# Patient Record
Sex: Male | Born: 1965 | Race: White | Hispanic: No | Marital: Married | Smoking: Never smoker
Health system: Southern US, Community
[De-identification: ages and names within clinical notes are randomized; demographics above are authoritative.]

## PROBLEM LIST (undated history)

## (undated) DIAGNOSIS — Z9289 Personal history of other medical treatment: Secondary | ICD-10-CM

## (undated) HISTORY — DX: Personal history of other medical treatment: Z92.89

---

## 2006-12-19 ENCOUNTER — Ambulatory Visit: Payer: Self-pay | Admitting: Internal Medicine

## 2006-12-28 ENCOUNTER — Ambulatory Visit: Payer: Self-pay | Admitting: Internal Medicine

## 2008-04-24 DIAGNOSIS — Z9289 Personal history of other medical treatment: Secondary | ICD-10-CM

## 2008-04-24 HISTORY — DX: Personal history of other medical treatment: Z92.89

## 2009-01-09 ENCOUNTER — Emergency Department: Payer: Self-pay | Admitting: Emergency Medicine

## 2009-01-12 IMAGING — CT CT ABD-PELV W/O CM
1 of 2 series · 15 of 32 positions shown, 19 images · non-contrast
Comparison: none

REASON FOR EXAM: hematuria back pain eval for kidney stones
COMMENTS:

[Series 2: soft tissue · axial · 0.76mm/px · z∈[-1142,-743]mm · 15 of 151 slices shown, 19 images]
[im 12/151  soft-tissue]
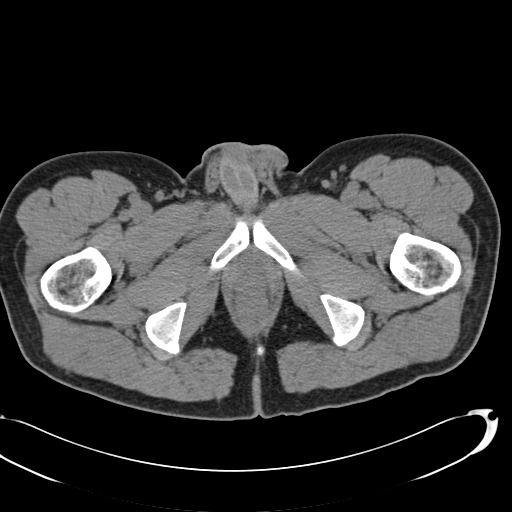
[im 12/151  bone]
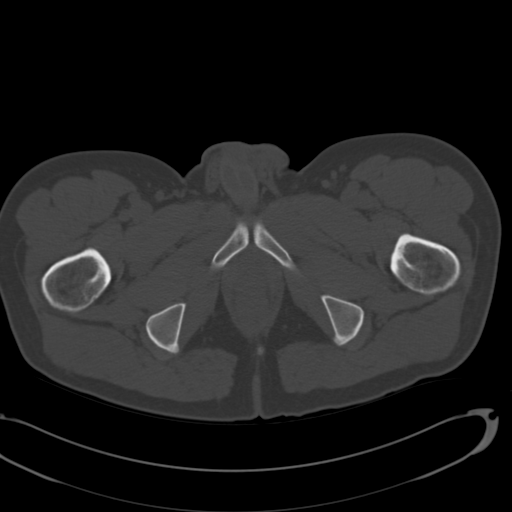
[im 23/151  soft-tissue]
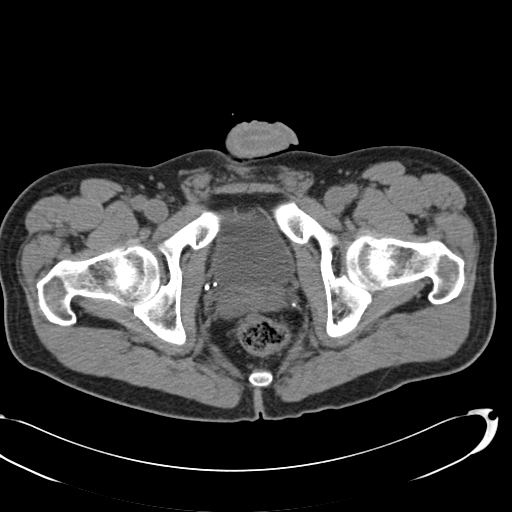
[im 34/151  soft-tissue]
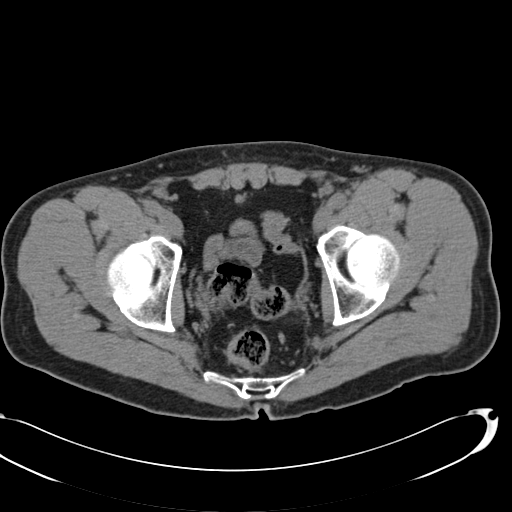
[im 45/151  soft-tissue]
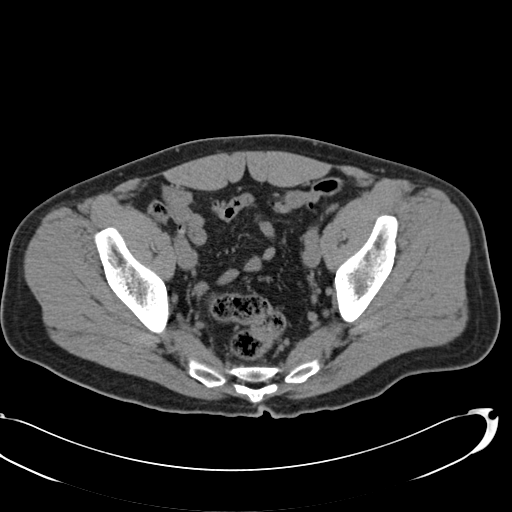
[im 56/151  soft-tissue]
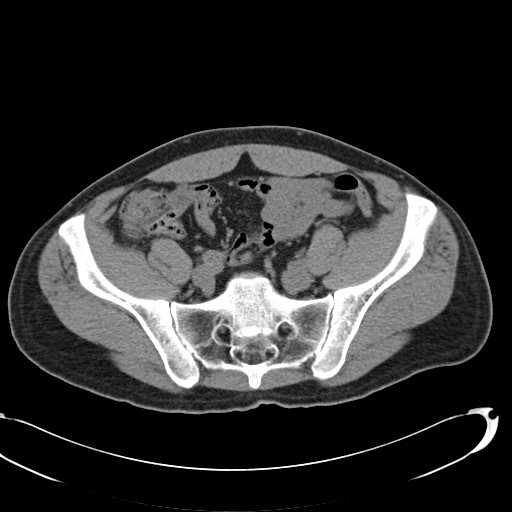
[im 67/151  soft-tissue]
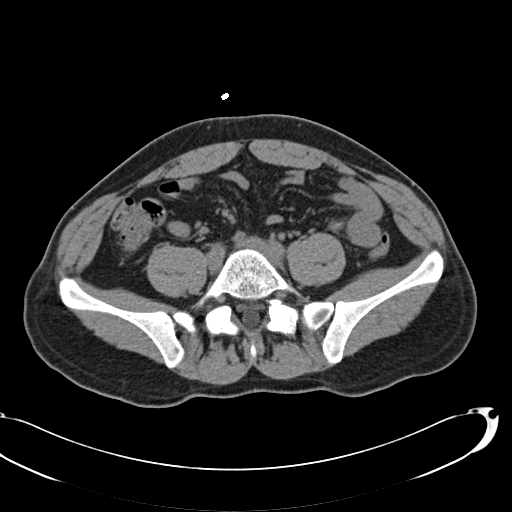
[im 78/151  soft-tissue]
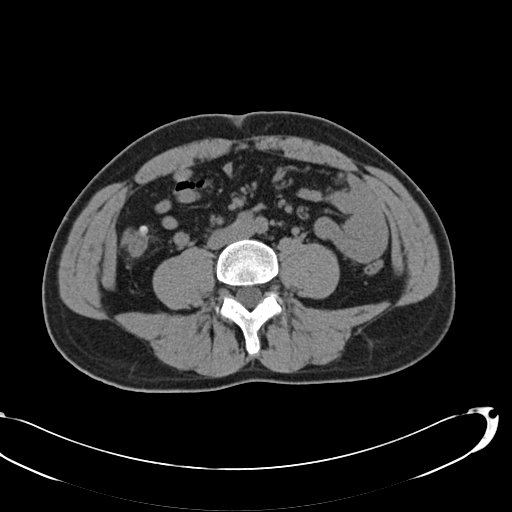
[im 89/151  soft-tissue]
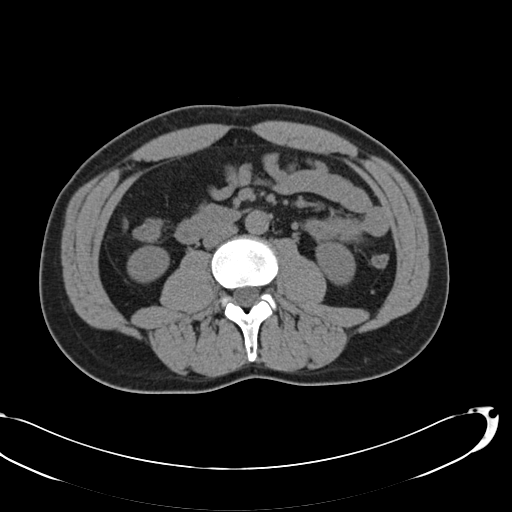
[im 101/151  soft-tissue]
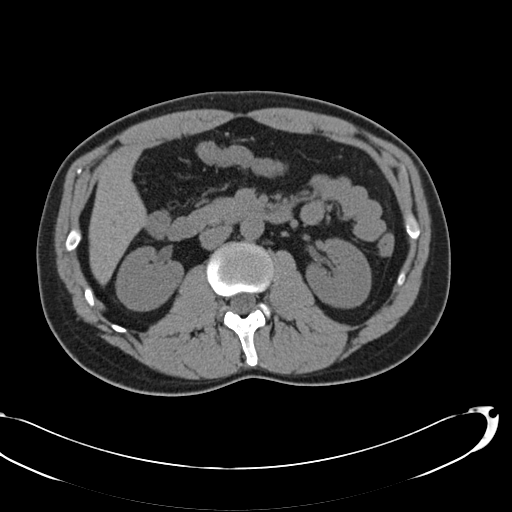
[im 101/151  bone]
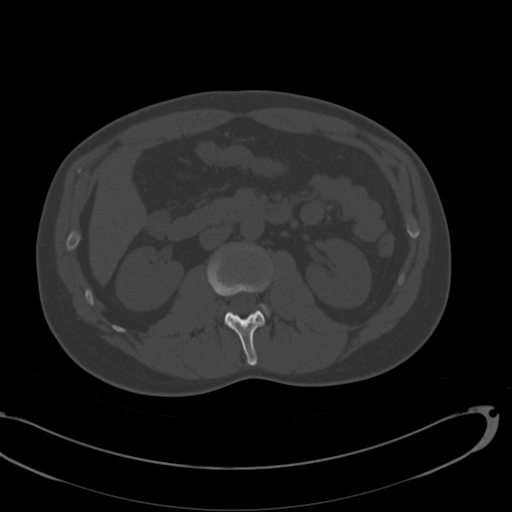
[im 112/151  soft-tissue]
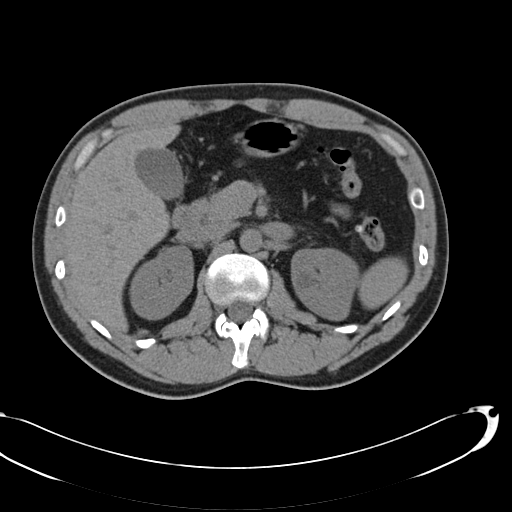
[im 123/151  soft-tissue]
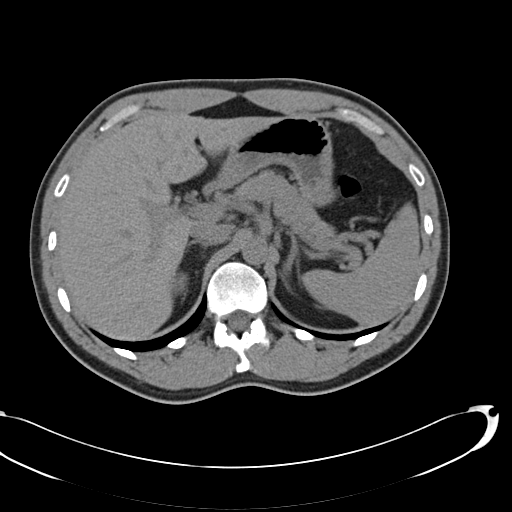
[im 128/151  lung]
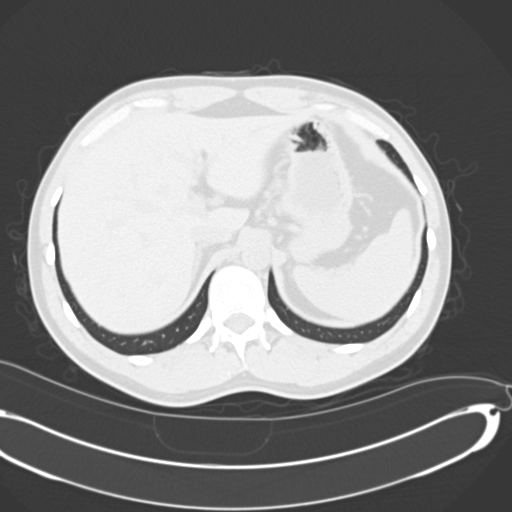
[im 134/151  soft-tissue]
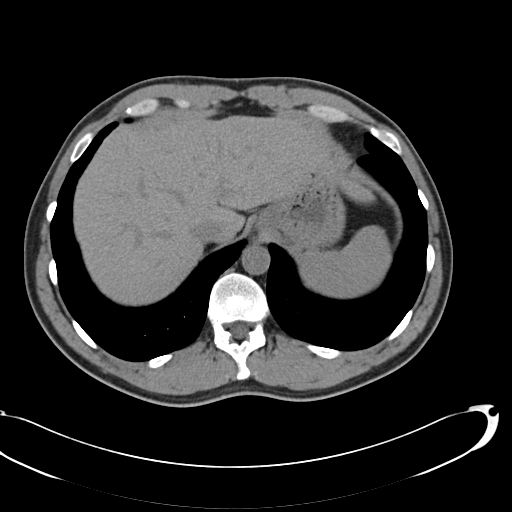
[im 134/151  lung]
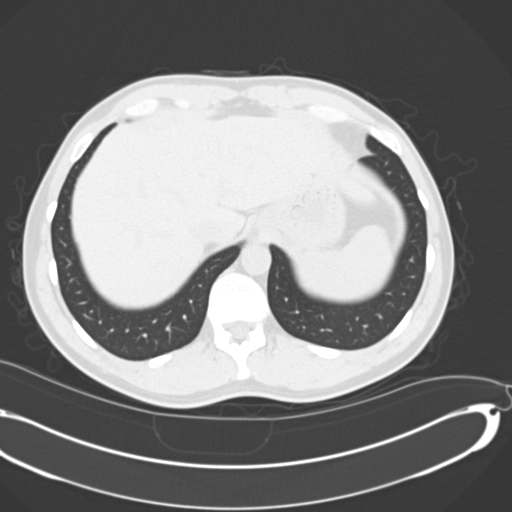
[im 139/151  lung]
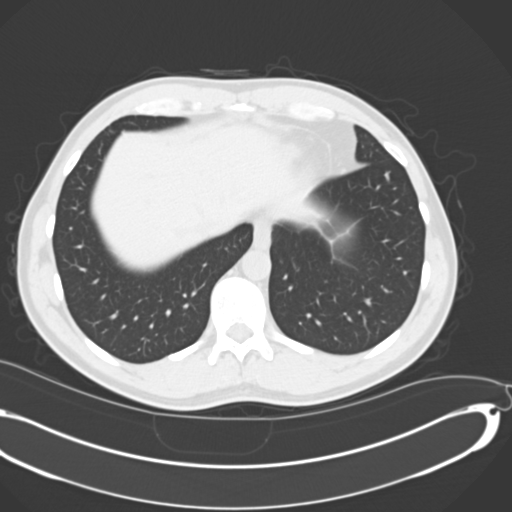
[im 145/151  soft-tissue]
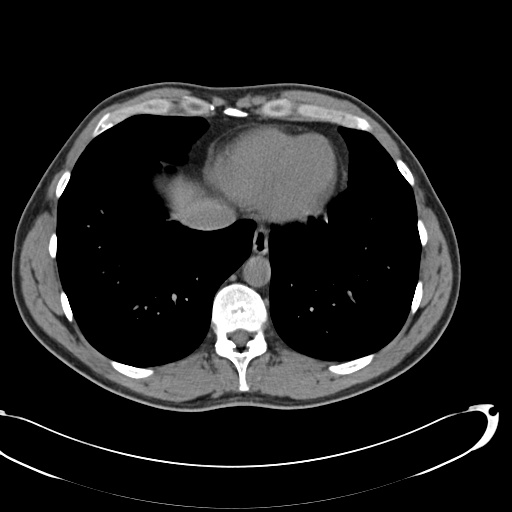
[im 145/151  lung]
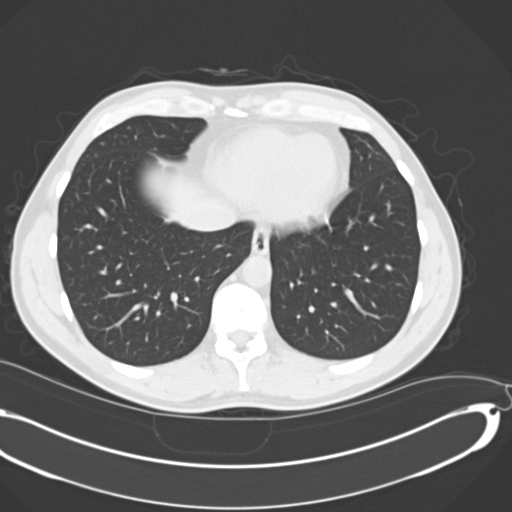

[15 of 32 positions shown; findings below may reference images not displayed]

PROCEDURE:     CT  - CT ABDOMEN AND PELVIS W[DATE]  [DATE]

RESULT:     Patient is undergoing evaluation for possible urinary tract
stones. The patient is complaining of low back pain and has microscopic
hematuria.

Neither the right nor left kidney exhibits evidence of hydronephrosis. In
the medial aspect of the midpole of the left kidney there is a 1.4 cm
diameter hypodensity with Hounsfield measurement of 16. This is most likely
a benign cyst. The perinephric fat is normal in appearance. Along the
expected course of the ureters no calcified stones are identified. There are
phleboliths within the pelvis. The partially distended urinary bladder is
normal in appearance. The prostate gland does not appear abnormally enlarged
for age. There is no free fluid in the pelvis.

There is scattered diverticula involving the colon but I see no evidence of
acute diverticulitis. The appendix is demonstrated and is normal in
appearance. The caliber of the abdominal aorta is normal. The liver,
gallbladder, spleen, stomach, pancreas, and adrenal glands are normal in
appearance. The lung bases are clear.
IMPRESSION: 1. There is no evidence of urinary tract obstruction. There is likely a cyst
in the midpole of the left kidney. Correlation with ultrasound of the
kidneys is recommended.
2. I do not see acute abnormality elsewhere within the abdomen or pelvis.
Scattered diverticula are noted within the bowel but there is no evidence of
acute diverticulitis.
3. The lumbar vertebral bodies are preserved in height and I do not see
significant disc space narrowing or other degenerative change.

If the patient's hematuria persists and remains unexplained, further
evaluation with a triphasic contrast-enhanced CT scan of the abdomen and
pelvis would be of value.

## 2009-01-19 ENCOUNTER — Emergency Department: Payer: Self-pay | Admitting: Emergency Medicine

## 2011-09-19 ENCOUNTER — Ambulatory Visit (INDEPENDENT_AMBULATORY_CARE_PROVIDER_SITE_OTHER): Payer: BC Managed Care – PPO | Admitting: Internal Medicine

## 2011-09-19 ENCOUNTER — Encounter: Payer: Self-pay | Admitting: Internal Medicine

## 2011-09-19 DIAGNOSIS — Z9289 Personal history of other medical treatment: Secondary | ICD-10-CM

## 2011-09-19 DIAGNOSIS — Z228 Carrier of other infectious diseases: Secondary | ICD-10-CM

## 2011-09-19 DIAGNOSIS — R079 Chest pain, unspecified: Secondary | ICD-10-CM

## 2011-09-19 DIAGNOSIS — R0789 Other chest pain: Secondary | ICD-10-CM

## 2011-09-19 DIAGNOSIS — R072 Precordial pain: Secondary | ICD-10-CM

## 2011-09-19 MED ORDER — ASPIRIN EC 81 MG PO TBEC: 81.0000 mg | DELAYED_RELEASE_TABLET | Freq: Every day | ORAL | Status: AC

## 2011-09-19 NOTE — Patient Instructions (Signed)
Return for fasting labs at your leisure  We will call you with the appt with [facility] cardiology for stress testing

## 2011-09-19 NOTE — Progress Notes (Signed)
Patient ID: Wesley Mcconnell, male   DOB: Jul 23, 1965, 46 y.o.   MRN: [id number]  Patient Active Problem List  Diagnoses  . History of positive PPD  . Chest pain, mid sternal    Subjective:  CC:   Chief Complaint  Patient presents with  . Annual Exam    HPI:   Wesley Mcconnell a 46 y.o. male who presents with a 9 month history of atypical chest pain/tightness occurring both with activity and at rest.   Sometimes accompanied by palpitations but denies concurrent diaphoresis,  Reflux symptoms. Jaw or shoulder pain, but occasionally radiates to back .  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  Gregary Signs is currently working on a murder case with [employer] and wants to attribute the chest pain to stress but has recently remarried and wants to rule out  CAD).  He has no history of DM or tobacco use. Drinks 1 beer daily.  No regular use of NSAIDs or aSA.    Past Medical History  Diagnosis Date  . History of positive PPD 2010    treated with isoniazid x 9 months     History reviewed. No pertinent past surgical history.    The following portions of the patient's history were reviewed and updated as appropriate: Allergies, current medications, and problem list.    Review of Systems:   12 Pt  review of systems was negative except those addressed in the HPI,     History   Social History  . Marital Status: Married    Spouse Name: N/A    Number of Children: N/A  . Years of Education: N/A   Occupational History  . Not on file.   Social History Main Topics  . Smoking status: Never Smoker   . Smokeless tobacco: Never Used  . Alcohol Use: Yes  . Drug Use: No  . Sexually Active: Not on file   Other Topics Concern  . Not on file   Social History Narrative  . No narrative on file    Objective:  BP 106/62   Pulse 72  Temp(Src) 98.4 F (36.9 C) (Oral)  Resp 14  Ht 6' (1.829 m)  Wt 185 lb (83.915 kg)  BMI 25.09 kg/m2  SpO2 97%  General appearance: alert, cooperative and appears stated age Ears: normal TM's and external ear canals both ears Throat: lips, mucosa, and tongue normal; teeth and gums normal Neck: no adenopathy, no carotid bruit, supple, symmetrical, trachea midline and thyroid not enlarged, symmetric, no tenderness/mass/nodules Back: symmetric, no curvature. ROM normal. No CVA tenderness. Lungs: clear to auscultation bilaterally Heart: regular rate and rhythm, S1, S2 normal, no murmur, click, rub or gallop Abdomen: soft, non-tender; bowel sounds normal; no masses,  no organomegaly Pulses: 2+ and symmetric Skin: Skin color, texture, turgor normal. No rashes or lesions Lymph nodes: Cervical, supraclavicular, and axillary nodes normal.  Assessment and Plan:  Chest pain, mid sternal Atypical, with no known cardiac risk factors.  , unknown lipid status. EKG done today shows only TWI in lead III only .  Has not worked out in 9 months due to the pain and change in residence and would like to start running but is hesitant to do so without clearance. Return for fasting lipids, lipase and refer to [facility] cardiology for  stress testing. Since he has no signs of gastritis on exam or by history,  Recommended taking a baby aspirin daily .  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  CXR was normal. Received 9 months of oral INH/.     Updated Medication List Outpatient Encounter Prescriptions as of 09/19/2011  Medication Sig Dispense Refill  . aspirin EC 81 MG tablet Take 1 tablet (81 mg total) by mouth daily.  30 tablet  11  . multivitamin-iron-minerals-folic acid (CENTRUM) chewable tablet Chew 1 tablet by mouth daily.         Orders Placed This Encounter  Procedures  . TSH  . Lipid panel    . COMPLETE METABOLIC PANEL WITH GFR  . CK  . Lipase  . Ambulatory referral to Cardiology  . Electrocardiogram report    No Follow-up on file.

## 2011-09-19 NOTE — Assessment & Plan Note (Signed)
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  CXR was normal. Received 9 months of oral INH/.

## 2011-09-19 NOTE — Assessment & Plan Note (Addendum)
Atypical, with no known cardiac risk factors.  , unknown lipid status. EKG done today shows only TWI in lead III only .  Has not worked out in 9 months due to the pain and change in residence and would like to start running but is hesitant to do so without clearance. Return for fasting lipids, lipase and refer to [facility] cardiology for stress testing. Since he has no signs of gastritis on exam or by history,  Recommended taking a baby aspirin daily .

## 2011-09-20 ENCOUNTER — Encounter: Payer: Self-pay | Admitting: Cardiovascular Disease

## 2011-09-20 ENCOUNTER — Ambulatory Visit (INDEPENDENT_AMBULATORY_CARE_PROVIDER_SITE_OTHER): Payer: BC Managed Care – PPO | Admitting: Cardiovascular Disease

## 2011-09-20 VITALS — BP 116/76 | HR 85 | Ht 72.0 in | Wt 188.0 lb

## 2011-09-20 DIAGNOSIS — R0789 Other chest pain: Secondary | ICD-10-CM

## 2011-09-20 DIAGNOSIS — R072 Precordial pain: Secondary | ICD-10-CM

## 2011-09-20 NOTE — Progress Notes (Signed)
    Wesley Mcconnell Date of Birth  Jul 13, 1965       [facility]    [place] [address]  [address] [place], [place]  [address]   [place], [place]  [address] [id number]     [phone]   Fax  [phone]    Fax [phone]  Problem List: 1. Chest pain 2. Palpitations  History of Present Illness:  Wesley Mcconnell is a 46 yo who is a healthy gentleman who presents with chest pain and palpitations.  For the past 9 months, he has had some episodes of chest tightness and flutters.  These were not associated with eating, drinking, change of position.  He has been active until a month or so ago .  He has not been able to exercise recently because he recently got married and has moved.  The pains are not associated with sweats, dyspnea, syncope, dizziness.    He works as the Mudlogger. He spends a lot of time at his desk and on the computer.  Current Outpatient Prescriptions on File Prior to Visit  Medication Sig Dispense Refill  . aspirin EC 81 MG tablet Take 1 tablet (81 mg total) by mouth daily.  30 tablet  11  . multivitamin-iron-minerals-folic acid (CENTRUM) chewable tablet Chew 1 tablet by mouth daily.        No Known Allergies  Past Medical History  Diagnosis Date  . History of positive PPD 2010    treated with isoniazid x 9 months     History reviewed. No pertinent past surgical history.  History  Smoking status  . Never Smoker   Smokeless tobacco  . Never Used    History  Alcohol Use  . 0.5 oz/week  . 1 drink(s) per week    Family History  Problem Relation Age of Onset  . Cancer Neg Hx   . Heart disease Neg Hx     Reviw of Systems:  Reviewed in the HPI.  All other systems are negative.  Physical Exam: Blood pressure 116/76, pulse 85, height 6' (1.829 m), weight 188 lb (85.276 kg). General: Well developed, well nourished, in no acute distress.  Head: Normocephalic, atraumatic, sclera non-icteric, mucus  membranes are moist,   Neck: Supple. Carotids are 2 + without bruits. No JVD  Lungs: Clear bilaterally to auscultation.  Heart: regular rate.  normal  S1 S2. No murmurs, gallops or rubs.  Abdomen: Soft, non-tender, non-distended with normal bowel sounds. No hepatomegaly. No rebound/guarding. No masses.  Msk:  Strength and tone are normal  Extremities: No clubbing or cyanosis. No edema.  Distal pedal pulses are 2+ and equal bilaterally.  Neuro: Alert and oriented X 3. Moves all extremities spontaneously.  Psych:  Responds to questions appropriately with a normal affect.  ECG: Sep 20, 2011-normal sinus rhythm at 79. There are no ST or T wave changes.  Assessment / Plan:

## 2011-09-20 NOTE — Patient Instructions (Signed)
Your physician wants you to follow-up as needed     

## 2011-09-20 NOTE — Assessment & Plan Note (Signed)
Wesley Mcconnell presents with some episodes of palpitations and vague chest discomfort. I suspect a lot of this is due to anxiety. He's able to work out on a regular basis and has not had any episodes of chest pain.  i Offered to schedule him for a stress test but he didn't think that he asked needed as long as I thought that he was okay. I reassured him that everything seemed to check out okay.  We will see him on an as-needed basis. He has labs that will  be drawn in Dr. Melina Schools office tomorrow.

## 2011-09-21 ENCOUNTER — Other Ambulatory Visit (INDEPENDENT_AMBULATORY_CARE_PROVIDER_SITE_OTHER): Payer: BC Managed Care – PPO | Admitting: *Deleted

## 2011-09-21 DIAGNOSIS — R7989 Other specified abnormal findings of blood chemistry: Secondary | ICD-10-CM

## 2011-09-21 DIAGNOSIS — R079 Chest pain, unspecified: Secondary | ICD-10-CM

## 2011-09-21 LAB — LIPASE: Lipase: 37 U/L (ref 11.0–59.0)

## 2011-09-21 LAB — LIPID PANEL
LDL Cholesterol: 123 mg/dL — ABNORMAL HIGH (ref 0–99)
Total CHOL/HDL Ratio: 5
Triglycerides: 90 mg/dL (ref 0.0–149.0)

## 2011-09-22 ENCOUNTER — Telehealth: Payer: Self-pay | Admitting: Internal Medicine

## 2011-09-22 DIAGNOSIS — R748 Abnormal levels of other serum enzymes: Secondary | ICD-10-CM | POA: Insufficient documentation

## 2011-09-22 DIAGNOSIS — R7989 Other specified abnormal findings of blood chemistry: Secondary | ICD-10-CM

## 2011-09-22 LAB — COMPLETE METABOLIC PANEL WITH GFR
ALT: 78 U/L — ABNORMAL HIGH (ref 0–53)
AST: 44 U/L — ABNORMAL HIGH (ref 0–37)
Albumin: 4.5 g/dL (ref 3.5–5.2)
CO2: 26 mEq/L (ref 19–32)
Calcium: 9.3 mg/dL (ref 8.4–10.5)
Chloride: 107 mEq/L (ref 96–112)
Creat: 1.17 mg/dL (ref 0.50–1.35)
GFR, Est African American: 87 mL/min
Potassium: 4.2 mEq/L (ref 3.5–5.3)
Sodium: 141 mEq/L (ref 135–145)
Total Protein: 6.7 g/dL (ref 6.0–8.3)

## 2011-09-22 NOTE — Progress Notes (Signed)
Addended by: Duncan Dull on: 09/22/2011 05:36 PM   Modules accepted: Orders

## 2011-10-11 NOTE — Telephone Encounter (Signed)
Opened in error

## 2011-10-23 ENCOUNTER — Other Ambulatory Visit (INDEPENDENT_AMBULATORY_CARE_PROVIDER_SITE_OTHER): Payer: BC Managed Care – PPO | Admitting: *Deleted

## 2011-10-23 DIAGNOSIS — R7989 Other specified abnormal findings of blood chemistry: Secondary | ICD-10-CM

## 2011-10-23 LAB — COMPREHENSIVE METABOLIC PANEL
ALT: 69 U/L — ABNORMAL HIGH (ref 0–53)
CO2: 26 mEq/L (ref 19–32)
Calcium: 9.1 mg/dL (ref 8.4–10.5)
Chloride: 103 mEq/L (ref 96–112)
Creatinine, Ser: 0.9 mg/dL (ref 0.4–1.5)
GFR: 94.36 mL/min (ref >60.00)
Glucose, Bld: 102 mg/dL — ABNORMAL HIGH (ref 70–99)
Total Bilirubin: 0.4 mg/dL (ref 0.3–1.2)

## 2011-10-23 LAB — HEPATIC FUNCTION PANEL
AST: 38 U/L — ABNORMAL HIGH (ref 0–37)
Albumin: 4.2 g/dL (ref 3.5–5.2)
Alkaline Phosphatase: 61 U/L (ref 39–117)
Bilirubin, Direct: 0.1 mg/dL (ref 0.0–0.3)
Total Bilirubin: 0.4 mg/dL (ref 0.3–1.2)

## 2011-10-25 LAB — HEPATITIS PANEL, ACUTE
HCV Ab: NEGATIVE
Hep A IgM: NEGATIVE

## 2011-10-27 NOTE — Addendum Note (Signed)
Addended by: Duncan Dull on: 10/27/2011 01:05 PM   Modules accepted: Orders

## 2011-11-02 ENCOUNTER — Ambulatory Visit: Payer: Self-pay | Admitting: Internal Medicine

## 2011-11-02 ENCOUNTER — Other Ambulatory Visit (INDEPENDENT_AMBULATORY_CARE_PROVIDER_SITE_OTHER): Payer: BC Managed Care – PPO | Admitting: *Deleted

## 2011-11-02 DIAGNOSIS — R7989 Other specified abnormal findings of blood chemistry: Secondary | ICD-10-CM

## 2011-11-02 LAB — IRON AND TIBC
%SAT: 30 % (ref 20–55)
Iron: 91 ug/dL (ref 42–165)
TIBC: 308 ug/dL (ref 215–435)
UIBC: 217 ug/dL (ref 125–400)

## 2011-11-03 LAB — ANTI-SMITH ANTIBODY: ENA SM Ab Ser-aCnc: <1 AU/mL (ref <30)

## 2011-11-06 ENCOUNTER — Telehealth: Payer: Self-pay | Admitting: *Deleted

## 2011-11-06 DIAGNOSIS — R7989 Other specified abnormal findings of blood chemistry: Secondary | ICD-10-CM

## 2011-11-06 DIAGNOSIS — R945 Abnormal results of liver function studies: Secondary | ICD-10-CM

## 2011-11-06 LAB — PROTEIN ELECTROPHORESIS, SERUM
Alpha-2-Globulin: 7.4 % (ref 7.1–11.8)
Beta 2: 4.8 % (ref 3.2–6.5)
Beta Globulin: 5.7 % (ref 4.7–7.2)
Gamma Globulin: 13.5 % (ref 11.1–18.8)

## 2011-11-06 NOTE — Telephone Encounter (Signed)
Referral in EP[facility] for [facility]

## 2011-11-06 NOTE — Telephone Encounter (Signed)
Pt informed of lab results, pt would like referral to GI specialist as advised by Dr. Darrick Huntsman.

## 2011-11-07 ENCOUNTER — Telehealth: Payer: Self-pay | Admitting: Internal Medicine

## 2011-11-07 NOTE — Telephone Encounter (Signed)
SHE WOULD BE A NEW PATIENT AT [facility].  THEIR FIRST AVAILABLE IS 8-29.  IS THAT TOO LONG TO WAIT.  DO YOU WANT ME TO CALL SOMEONE ELSE

## 2011-11-07 NOTE — Telephone Encounter (Signed)
Can you try [facility] at [facility]?  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

## 2011-11-14 ENCOUNTER — Encounter: Payer: Self-pay | Admitting: Internal Medicine

## 2011-12-14 ENCOUNTER — Encounter: Payer: Self-pay | Admitting: Internal Medicine

## 2013-11-17 IMAGING — US ABDOMEN ULTRASOUND LIMITED
1 series · 14 of 25 positions shown · non-contrast
Comparison: none

REASON FOR EXAM: abn blood chemistry
COMMENTS:

PROCEDURE:     RUDI - RUDI ABDOMEN UPPER GENERAL  - November 02, 2011 [DATE]
RESULT:     Comparison: None.
TECHNIQUE: Multiple grayscale and color Doppler images were obtained of the
abdomen.

[Series 1: abdomen ultrasound limited · 0.25mm/px · 14 of 101 slices shown]
[im 1/101]
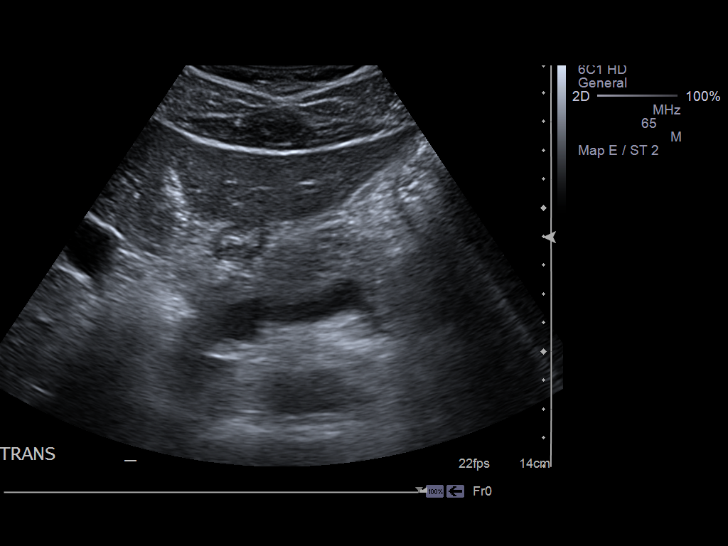
[im 9/101]
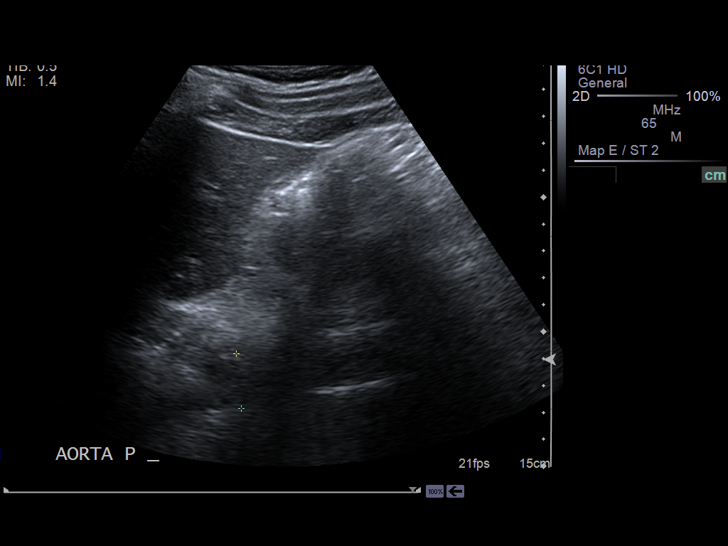
[im 17/101]
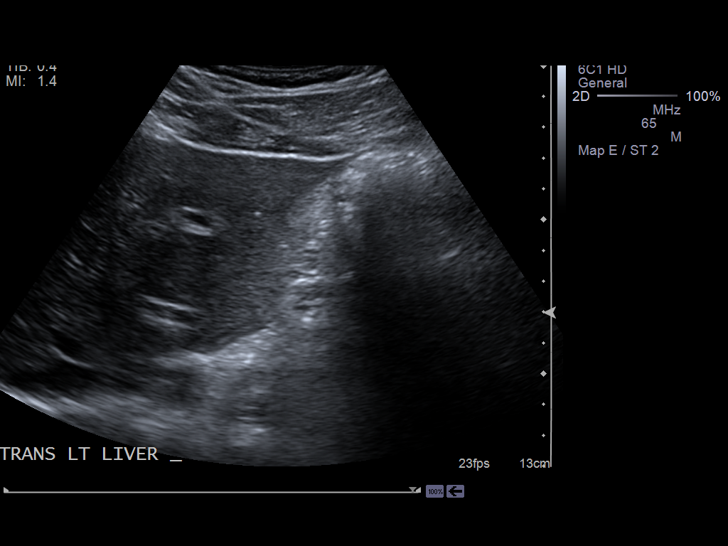
[im 26/101]
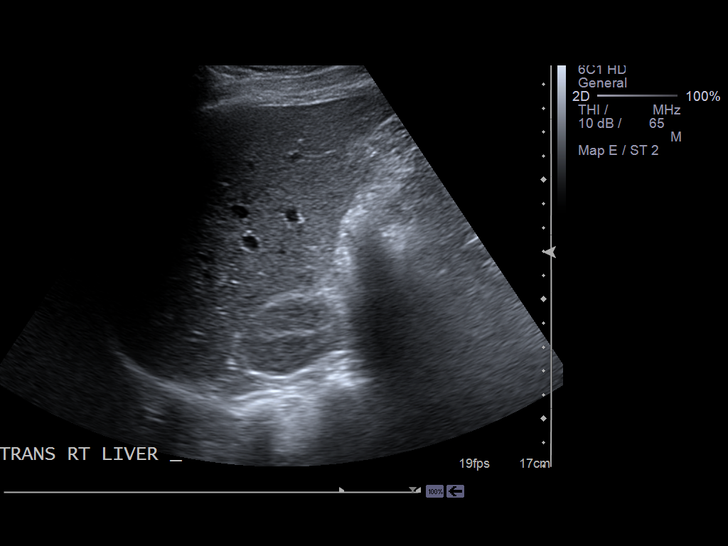
[im 34/101]
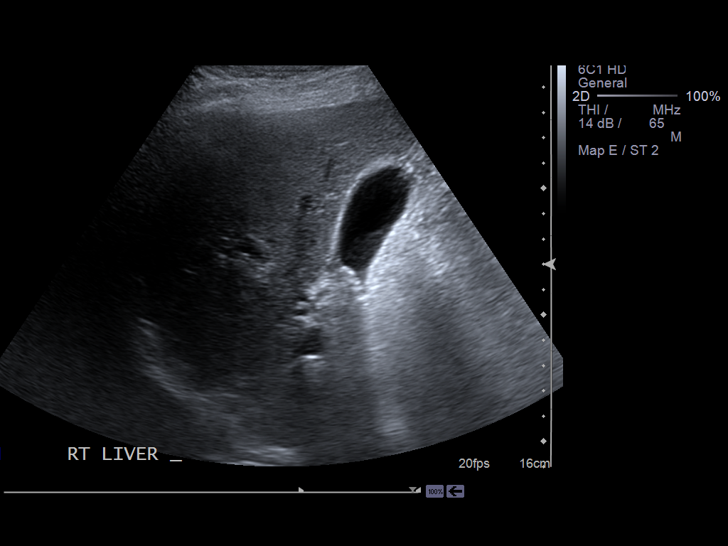
[im 38/101]
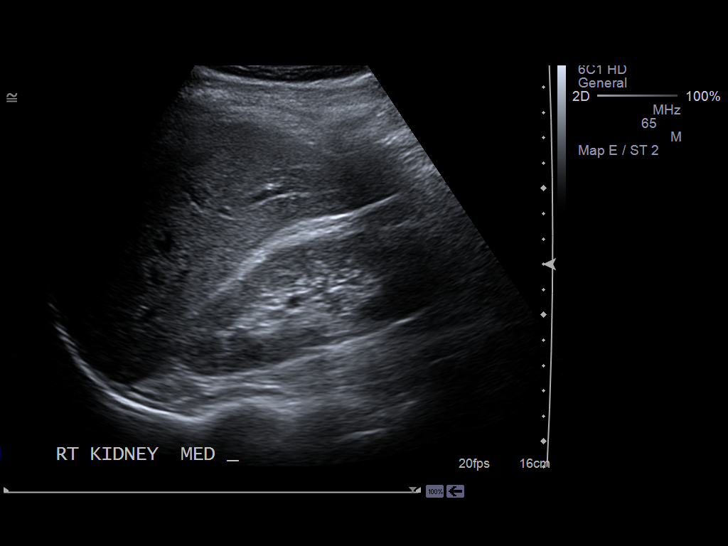
[im 46/101]
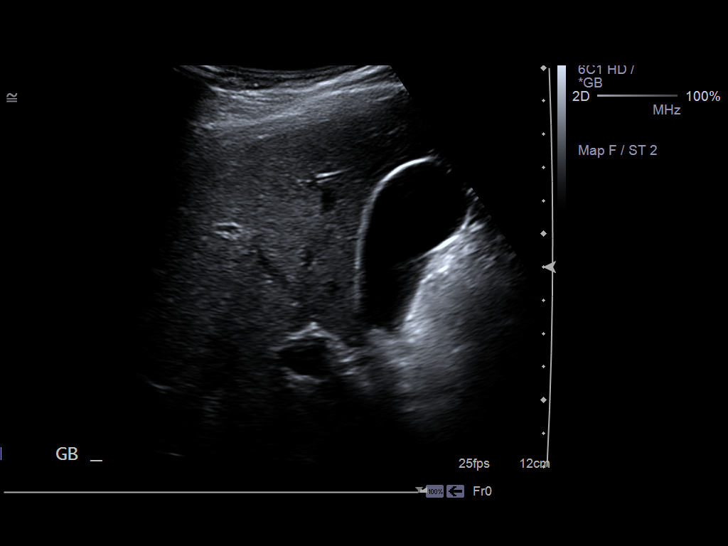
[im 55/101]
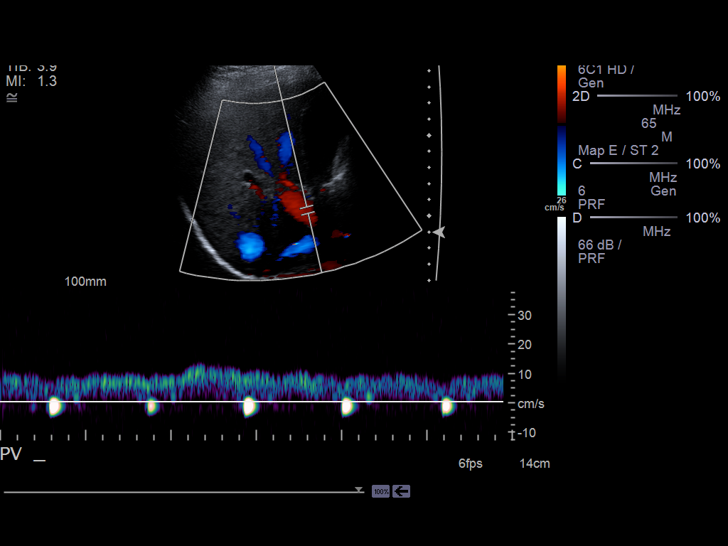
[im 63/101]
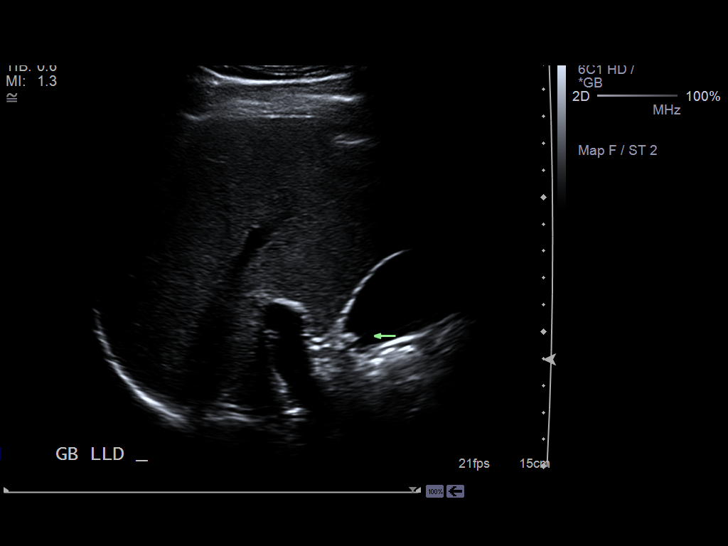
[im 67/101]
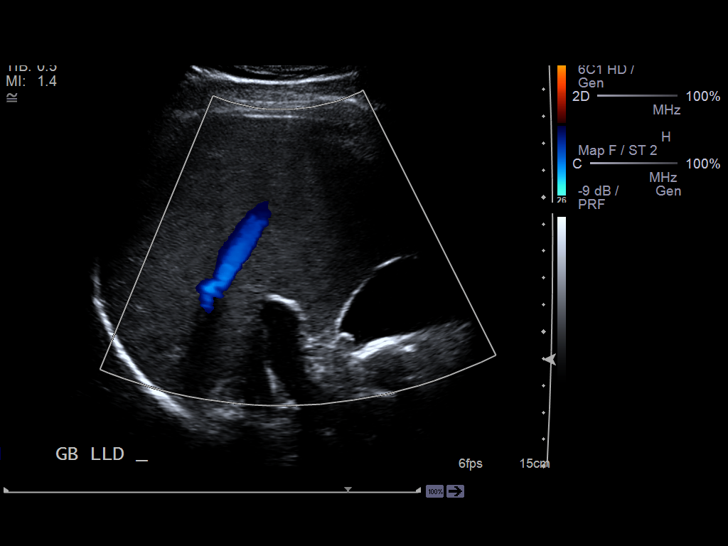
[im 76/101]
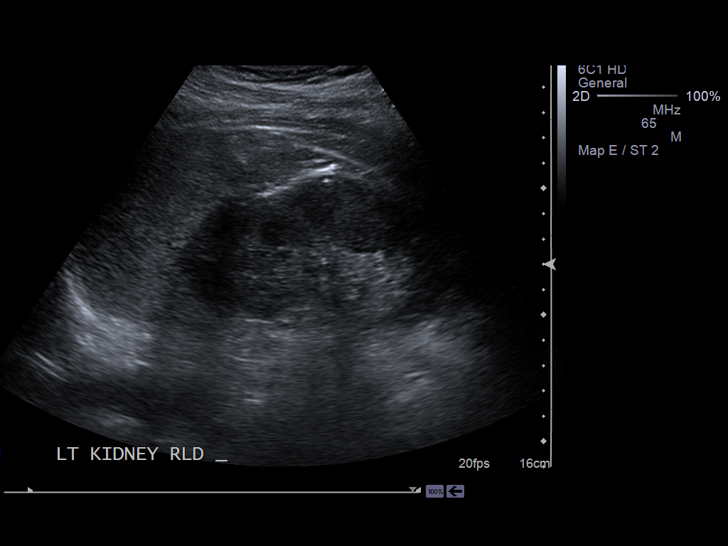
[im 84/101]
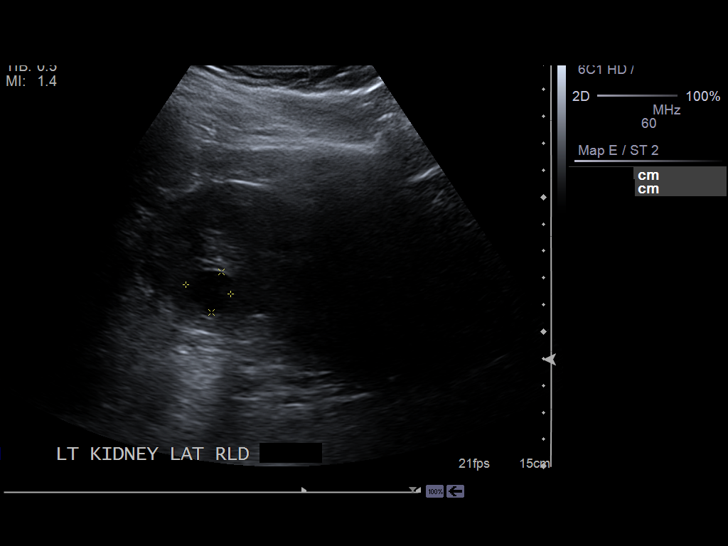
[im 92/101]
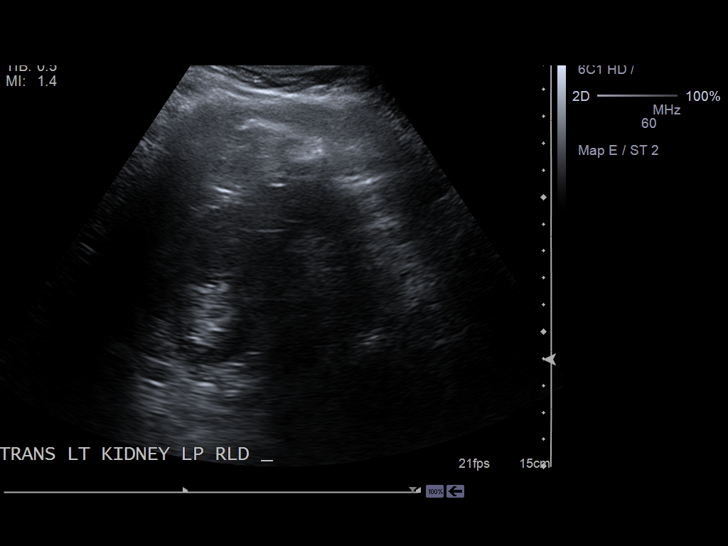
[im 101/101]
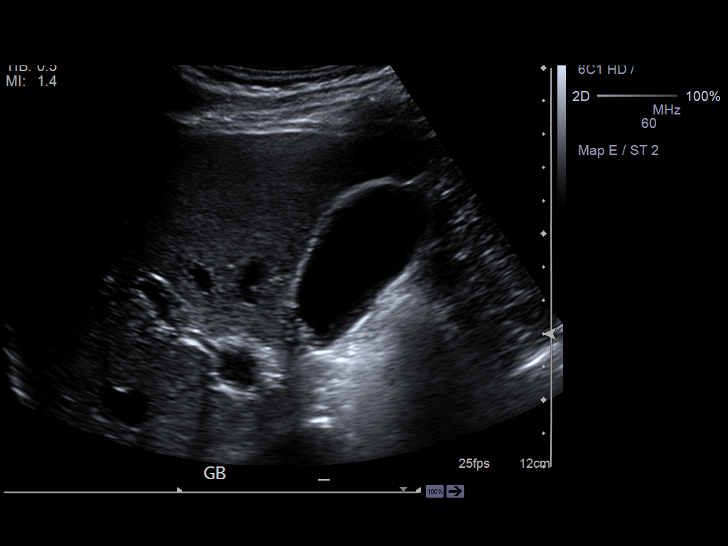

[14 of 25 positions shown; findings below may reference images not displayed]

FINDINGS: The pancreas was partially obscured by overlying bowel gas. The visualized
portion of the pancreas is unremarkable. The visualized liver and spleen are
unremarkable. There is an 8mm round echogenic focus in the region the
gallbladder neck which could represent a nonmobile gallstone versus a
subcentimeter polyp. No gallbladder wall thickening or pericholecystic
fluid. The sonographic Murphy sign was negative. The common bile duct
measures 5 mm.

Images of the kidneys showed no hydronephrosis. There is a small cyst in the
superior pole the left kidney which measures 1.7 x 1.6 x 1.6 cm.
IMPRESSION: 1. Subcentimeter nonmobile gallstone versus gallbladder polyp in the region
of the gallbladder neck. As a polyp of this size is in the differential
consideration, followup right upper quadrant ultrasound is recommended in 6
months to ensure stability/resolution.
2. Otherwise, no acute findings.

[REDACTED]

## 2017-02-01 ENCOUNTER — Encounter: Payer: Self-pay | Admitting: Internal Medicine

## 2017-02-01 ENCOUNTER — Ambulatory Visit (INDEPENDENT_AMBULATORY_CARE_PROVIDER_SITE_OTHER): Payer: 59 | Admitting: Internal Medicine

## 2017-02-01 VITALS — BP 124/84 | HR 68 | Temp 98.0°F | Resp 16 | Ht 72.0 in | Wt 197.2 lb

## 2017-02-01 DIAGNOSIS — K824 Cholesterolosis of gallbladder: Secondary | ICD-10-CM | POA: Diagnosis not present

## 2017-02-01 DIAGNOSIS — R748 Abnormal levels of other serum enzymes: Secondary | ICD-10-CM

## 2017-02-01 DIAGNOSIS — Z9289 Personal history of other medical treatment: Secondary | ICD-10-CM

## 2017-02-01 DIAGNOSIS — Z0000 Encounter for general adult medical examination without abnormal findings: Secondary | ICD-10-CM

## 2017-02-01 DIAGNOSIS — R5383 Other fatigue: Secondary | ICD-10-CM

## 2017-02-01 DIAGNOSIS — R7611 Nonspecific reaction to tuberculin skin test without active tuberculosis: Secondary | ICD-10-CM

## 2017-02-01 DIAGNOSIS — E785 Hyperlipidemia, unspecified: Secondary | ICD-10-CM | POA: Diagnosis not present

## 2017-02-01 DIAGNOSIS — E782 Mixed hyperlipidemia: Secondary | ICD-10-CM | POA: Diagnosis not present

## 2017-02-01 DIAGNOSIS — E663 Overweight: Secondary | ICD-10-CM | POA: Diagnosis not present

## 2017-02-01 DIAGNOSIS — Z125 Encounter for screening for malignant neoplasm of prostate: Secondary | ICD-10-CM | POA: Diagnosis not present

## 2017-02-01 LAB — CBC WITH DIFFERENTIAL/PLATELET
BASOS ABS: 0 10*3/uL (ref 0.0–0.1)
Basophils Relative: 0.8 % (ref 0.0–3.0)
EOS ABS: 0.1 10*3/uL (ref 0.0–0.7)
Eosinophils Relative: 2.5 % (ref 0.0–5.0)
HCT: 44.8 % (ref 39.0–52.0)
Hemoglobin: 15.6 g/dL (ref 13.0–17.0)
LYMPHS ABS: 1.9 10*3/uL (ref 0.7–4.0)
Lymphocytes Relative: 32.2 % (ref 12.0–46.0)
MCHC: 34.8 g/dL (ref 30.0–36.0)
MCV: 92.5 fl (ref 78.0–100.0)
Monocytes Absolute: 0.5 10*3/uL (ref 0.1–1.0)
Monocytes Relative: 9.2 % (ref 3.0–12.0)
NEUTROS ABS: 3.3 10*3/uL (ref 1.4–7.7)
NEUTROS PCT: 55.3 % (ref 43.0–77.0)
PLATELETS: 286 10*3/uL (ref 150.0–400.0)
RBC: 4.84 Mil/uL (ref 4.22–5.81)
RDW: 13.4 % (ref 11.5–15.5)
WBC: 5.9 10*3/uL (ref 4.0–10.5)

## 2017-02-01 LAB — COMPREHENSIVE METABOLIC PANEL
ALT: 34 U/L (ref 0–53)
AST: 24 U/L (ref 0–37)
Albumin: 4.9 g/dL (ref 3.5–5.2)
Alkaline Phosphatase: 76 U/L (ref 39–117)
BILIRUBIN TOTAL: 0.6 mg/dL (ref 0.2–1.2)
BUN: 10 mg/dL (ref 6–23)
CALCIUM: 9.7 mg/dL (ref 8.4–10.5)
CHLORIDE: 102 meq/L (ref 96–112)
CO2: 31 meq/L (ref 19–32)
CREATININE: 1.07 mg/dL (ref 0.40–1.50)
GFR: 77.52 mL/min (ref >60.00)
GLUCOSE: 89 mg/dL (ref 70–99)
Potassium: 4.6 mEq/L (ref 3.5–5.1)
SODIUM: 140 meq/L (ref 135–145)
Total Protein: 7.4 g/dL (ref 6.0–8.3)

## 2017-02-01 LAB — TSH: TSH: 3.83 u[IU]/mL (ref 0.35–4.50)

## 2017-02-01 LAB — LIPID PANEL
Cholesterol: 216 mg/dL — ABNORMAL HIGH (ref 0–200)
HDL: 36.1 mg/dL — ABNORMAL LOW (ref >39.00)
LDL CALC: 151 mg/dL — AB (ref 0–99)
NONHDL: 179.68
Total CHOL/HDL Ratio: 6
Triglycerides: 145 mg/dL (ref 0.0–149.0)
VLDL: 29 mg/dL (ref 0.0–40.0)

## 2017-02-01 LAB — PSA: PSA: 0.38 ng/mL (ref 0.10–4.00)

## 2017-02-01 MED ORDER — TRAMADOL HCL 50 MG PO TABS: 50.0000 mg | ORAL_TABLET | Freq: Two times a day (BID) | ORAL | 3 refills | Status: DC | PRN

## 2017-02-01 NOTE — Patient Instructions (Addendum)
Welcome back!!!  And Congratulations !   We are getting that long overdue follow up ultrasound to check on the polyp that was seen in your gallbadder in 2013.  The office will call you to set that up  For colon Ca screening:  Cologuard vs colonoscopy. You pick and let me know which one you want to have done    For the diet:   You might want to try a premixed protein drink called Premier Protein shake for breakfast or late night snack . It is great tasting,   very low sugar and available of < $2 serving at [facility] and  In bulk for $1.50/serving at [employer] and [employer]  .    Nutritional analysis :  160 cal  30 g protein  1 g sugar 50% calcium needs   Nicolette Bang and [employer]

## 2017-02-01 NOTE — Progress Notes (Signed)
Patient ID: Wesley Mcconnell, male    DOB: 05-Feb-1966  Age: 51 y.o. MRN: [id number]  The patient is here for annual  preventive  examination and management of other chronic and acute problems.   The risk factors are reflected in the social history.  The roster of all physicians providing medical care to patient - is listed in the Snapshot section of the chart.  Activities of daily living:  The patient is 100% independent in all ADLs: dressing, toileting, feeding as well as independent mobility  Home safety : The patient has smoke detectors in the home. They wear seatbelts.  There are no firearms at home. There is no violence in the home.   There is no risks for hepatitis, STDs or HIV. There is no   history of blood transfusion. They have no travel history to infectious disease endemic areas of the world.  The patient has seen their dentist in the last six month. They have seen their eye doctor in the last year.  They do not  have excessive sun exposure. Discussed the need for sun protection: hats, long sleeves and use of sunscreen if there is significant sun exposure.   Diet: the importance of a healthy diet is discussed. They do have a healthy diet.  The benefits of regular aerobic exercise were discussed. No regular exercise.   Depression screen: there are no signs or vegative symptoms of depression- irritability, change in appetite, anhedonia, sadness/tearfullness.   The following portions of the patient's history were reviewed and updated as appropriate: allergies, current medications, past family history, past medical history,  past surgical history, past social history  and problem list.  Visual acuity was not assessed per patient preference since she has regular follow up with her ophthalmologist. Hearing and body mass index were assessed and reviewed.   During the course of the visit the patient was educated and counseled about appropriate screening and preventive services including :  fall prevention , diabetes screening, nutrition counseling, colorectal cancer screening, and recommended immunizations.    CC: The primary encounter diagnosis was Encounter for preventive health examination. Diagnoses of Gallbladder polyp, Fatigue, unspecified type, Screening for prostate cancer, Hyperlipidemia LDL goal <130, Elevated liver enzymes, History of positive PPD, Overweight (BMI 25.0-29.9), and Mixed hyperlipidemia were also pertinent to this visit.  Last seen in 2013 for chest pain .  Referred to [facility].   Low risk for ischemia, but offered a stress test for peace of mind and deferred.   History of Elevated liver enzymes at last visit.  Did not return for repeat evaluation   Divorced in 2009.  Has remarried  In 2013.  Has an 54 yr old step son   Still has lots of stress related to career.  Newly elected [address] for [employer], still has palpitations which occur only at night.  5 cats and a dog    History Wesley Mcconnell has a past medical history of History of positive PPD (2010).   He has no past surgical history on file.   His family history includes CAD in his maternal grandmother.He reports that he has never smoked. He has never used smokeless tobacco. He reports that he drinks about 0.5 oz of alcohol per week . He reports that he does not use drugs.  Outpatient Medications Prior to Visit  Medication Sig Dispense Refill  . multivitamin-iron-minerals-folic acid (CENTRUM) chewable tablet Chew 1 tablet by mouth daily.    Marland Kitchen aspirin EC 81 MG tablet Take 1 tablet (  81 mg total) by mouth daily. 30 tablet 11   No facility-administered medications prior to visit.     Review of Systems   Patient denies headache, fevers, malaise, unintentional weight loss, skin rash, eye pain, sinus congestion and sinus pain, sore throat, dysphagia,  hemoptysis , cough, dyspnea, wheezing, chest pain, palpitations, orthopnea, edema, abdominal pain, nausea, melena, diarrhea, constipation, flank  pain, dysuria, hematuria, urinary  Frequency, nocturia, numbness, tingling, seizures,  Focal weakness, Loss of consciousness,  Tremor, insomnia, depression, anxiety, and suicidal ideation.      Objective:  BP 124/84 (BP Location: Left Arm, Patient Position: Sitting, Cuff Size: Normal)   Pulse 68   Temp 98 F (36.7 C) (Oral)   Resp 16   Ht 6' (1.829 m)   Wt 197 lb 3.2 oz (89.4 kg)   SpO2 98%   BMI 26.75 kg/m   Physical Exam   General appearance: alert, cooperative and appears stated age Ears: normal TM's and external ear canals both ears Throat: lips, mucosa, and tongue normal; teeth and gums normal Neck: no adenopathy, no carotid bruit, supple, symmetrical, trachea midline and thyroid not enlarged, symmetric, no tenderness/mass/nodules Back: symmetric, no curvature. ROM normal. No CVA tenderness. Lungs: clear to auscultation bilaterally Heart: regular rate and rhythm, S1, S2 normal, no murmur, click, rub or gallop Abdomen: soft, non-tender; bowel sounds normal; no masses,  no organomegaly Pulses: 2+ and symmetric Skin: Skin color, texture, turgor normal. No rashes or lesions Lymph nodes: Cervical, supraclavicular, and axillary nodes normal.    Assessment & Plan:   Problem List Items Addressed This Visit    Elevated liver enzymes    Repeat liver enzymes are normal.  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 Ultrasound in 2013 noted a polyp and he was referred to GI deferred.  epeat ultrasound ordered.   Lab Results  Component Value Date   ALT 34 02/01/2017   AST 24 02/01/2017   ALKPHOS 76 02/01/2017   BILITOT 0.6 02/01/2017        Encounter for preventive health examination - Primary    Annual comprehensive preventive exam was done as well as an evaluation and management of acute and chronic conditions .  During the course of the visit the patient was educated and counseled about appropriate screening and preventive  services including :  diabetes screening, lipid analysis with projected  10 year  risk for CAD , nutrition counseling, prostate and colorectal cancer screening, and recommended immunizations.  Printed recommendations for health maintenance screenings was given.   colonoscopy vs cologuard discussed; he prefers noninvasive testing.   Lab Results  Component Value Date   PSA 0.38 02/01/2017         History of positive PPD    He was treated with INH for 9 months in 2010      Hyperlipidemia     Using the Framingham risk calculator,  his 10 year risk of coronary artery disease is 11%.  Will recommend red yeast rice,  Mediterranean diet and regular exercise and repeat in 6 months.   Lab Results  Component Value Date   CHOL 216 (H) 02/01/2017   HDL 36.10 (L) 02/01/2017   LDLCALC 151 (H) 02/01/2017   TRIG 145.0 02/01/2017   CHOLHDL 6 02/01/2017         Overweight (BMI 25.0-29.9)    With  Mild hyperlipidemia. I have addressed  BMI and recommended wt loss of 10% of body weigh over the next  6 months using a low glycemic index diet and regular exercise a minimum of 5 days per week.   Lab Results  Component Value Date   CHOL 216 (H) 02/01/2017   HDL 36.10 (L) 02/01/2017   LDLCALC 151 (H) 02/01/2017   TRIG 145.0 02/01/2017   CHOLHDL 6 02/01/2017          Other Visit Diagnoses    Gallbladder polyp       Relevant Orders   US Abdomen Limited RUQ   Fatigue, unspecified type       Relevant Orders   Comprehensive metabolic panel (Completed)   TSH (Completed)   CBC with Differential/Platelet (Completed)   Screening for prostate cancer       Relevant Orders   PSA (Completed)   Hyperlipidemia LDL goal <130       Relevant Orders   Lipid panel (Completed)      I have discontinued Mr. Topham's traMADol. I am also having him maintain his multivitamin-iron-minerals-folic acid and aspirin EC.  Meds ordered this encounter  Medications  . DISCONTD: traMADol (ULTRAM) 50 MG tablet     Sig: Take 1 tablet (50 mg total) by mouth every 12 (twelve) hours as needed.    Dispense:  60 tablet    Refill:  3    Medications Discontinued During This Encounter  Medication Reason  . traMADol (ULTRAM) 50 MG tablet     Follow-up: No Follow-up on file.   Sherlene Shams, MD

## 2017-02-03 DIAGNOSIS — Z0000 Encounter for general adult medical examination without abnormal findings: Secondary | ICD-10-CM | POA: Insufficient documentation

## 2017-02-03 DIAGNOSIS — E663 Overweight: Secondary | ICD-10-CM | POA: Insufficient documentation

## 2017-02-03 DIAGNOSIS — E785 Hyperlipidemia, unspecified: Secondary | ICD-10-CM | POA: Insufficient documentation

## 2017-02-03 NOTE — Assessment & Plan Note (Addendum)
With  Mild hyperlipidemia. I have addressed  BMI and recommended wt loss of 10% of body weigh over the next 6 months using a low glycemic index diet and regular exercise a minimum of 5 days per week.   Lab Results  Component Value Date   CHOL 216 (H) 02/01/2017   HDL 36.10 (L) 02/01/2017   LDLCALC 151 (H) 02/01/2017   TRIG 145.0 02/01/2017   CHOLHDL 6 02/01/2017

## 2017-02-03 NOTE — Assessment & Plan Note (Addendum)
Annual comprehensive preventive exam was done as well as an evaluation and management of acute and chronic conditions .  During the course of the visit the patient was educated and counseled about appropriate screening and preventive services including :  diabetes screening, lipid analysis with projected  10 year  risk for CAD , nutrition counseling, prostate and colorectal cancer screening, and recommended immunizations.  Printed recommendations for health maintenance screenings was given.   colonoscopy vs cologuard discussed; he prefers noninvasive testing.   Lab Results  Component Value Date   PSA 0.38 02/01/2017

## 2017-02-03 NOTE — Assessment & Plan Note (Signed)
He was treated with INH for 9 months in 2010

## 2017-02-03 NOTE — Assessment & Plan Note (Signed)
  Using the Framingham risk calculator,  his 10 year risk of coronary artery disease is 11%.  Will recommend red yeast rice,  Mediterranean diet and regular exercise and repeat in 6 months.   Lab Results  Component Value Date   CHOL 216 (H) 02/01/2017   HDL 36.10 (L) 02/01/2017   LDLCALC 151 (H) 02/01/2017   TRIG 145.0 02/01/2017   CHOLHDL 6 02/01/2017

## 2017-02-03 NOTE — Assessment & Plan Note (Addendum)
Repeat liver enzymes are normal.  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 Ultrasound in 2013 noted a polyp and he was referred to GI deferred.  epeat ultrasound ordered.   Lab Results  Component Value Date   ALT 34 02/01/2017   AST 24 02/01/2017   ALKPHOS 76 02/01/2017   BILITOT 0.6 02/01/2017

## 2017-02-13 ENCOUNTER — Ambulatory Visit
Admission: RE | Admit: 2017-02-13 | Discharge: 2017-02-13 | Disposition: A | Discharge status: Home / Self Care | Payer: 59 | Source: Ambulatory Visit | Attending: Internal Medicine | Admitting: Internal Medicine

## 2017-02-13 DIAGNOSIS — K824 Cholesterolosis of gallbladder: Secondary | ICD-10-CM | POA: Diagnosis present

## 2017-02-13 DIAGNOSIS — K828 Other specified diseases of gallbladder: Secondary | ICD-10-CM | POA: Diagnosis not present

## 2017-09-10 ENCOUNTER — Telehealth: Payer: Self-pay | Admitting: Internal Medicine

## 2017-09-10 DIAGNOSIS — Z1211 Encounter for screening for malignant neoplasm of colon: Secondary | ICD-10-CM

## 2017-09-10 NOTE — Telephone Encounter (Signed)
[place] does cover cologuard. [employer] is the only insurance company that does not cover cologuard.

## 2017-09-10 NOTE — Telephone Encounter (Signed)
He needs to confirm that his insurance covers it.  Then we will order it

## 2017-09-10 NOTE — Telephone Encounter (Signed)
Please order cologuard.

## 2017-09-10 NOTE — Telephone Encounter (Signed)
Is it okay to order the cologuard for pt? 

## 2017-09-10 NOTE — Telephone Encounter (Signed)
Copied from CRM [id number]. Topic: Quick Communication - See Telephone Encounter >> Sep 10, 2017  1:26 PM Raquel Sarna wrote: Pt wanting to start w/ Cologuard test.  Please call pt back to let him know what he needs to do to get started.

## 2017-09-12 NOTE — Telephone Encounter (Signed)
cologuard has been ordered 

## 2017-09-25 LAB — COLOGUARD: COLOGUARD: NEGATIVE

## 2017-10-10 ENCOUNTER — Telehealth: Payer: Self-pay | Admitting: Internal Medicine

## 2017-10-10 NOTE — Telephone Encounter (Signed)
The results of your cologuard test were negative.    mychart message sent and Results have been abstracted.

## 2017-10-11 NOTE — Telephone Encounter (Signed)
Pt is aware of results. Pt stated that he received his mychart message.

## 2018-09-09 IMAGING — US US ABDOMEN LIMITED
1 series · 14 of 25 positions shown · non-contrast
Comparison: 11/02/2011 ultrasound.

CLINICAL DATA: 51-year-old male with elevated liver enzymes.
History of gallbladder polyp versus stone. Initial encounter.

EXAM:
ULTRASOUND ABDOMEN LIMITED RIGHT UPPER QUADRANT

[Series 1: us abdomen limited · 0.20mm/px · 14 of 57 slices shown]
[im 1/57]
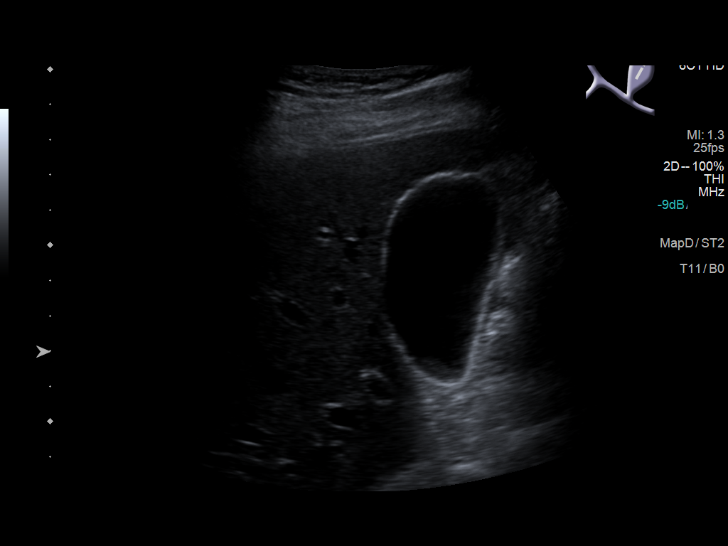
[im 5/57]
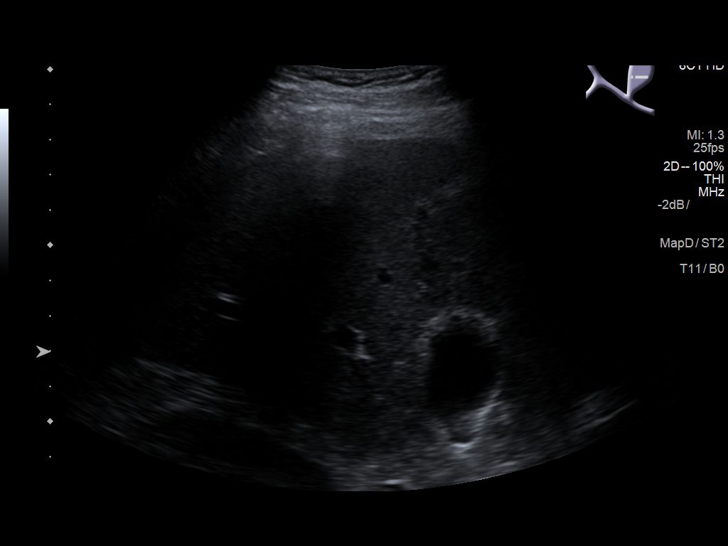
[im 10/57]
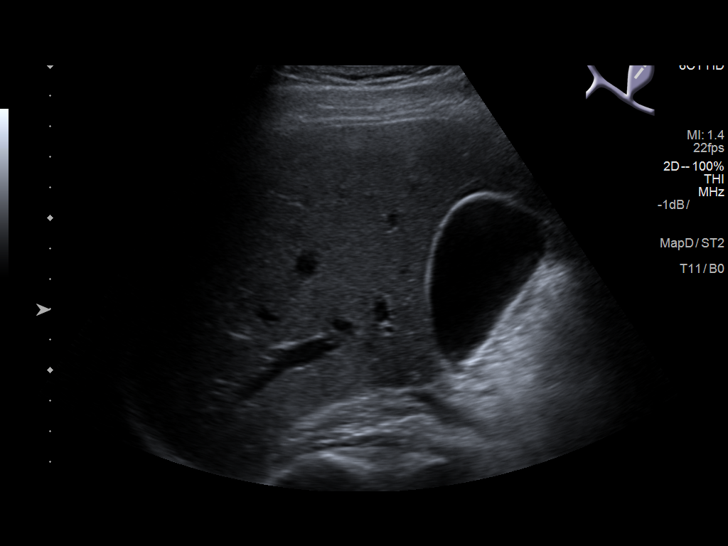
[im 15/57]
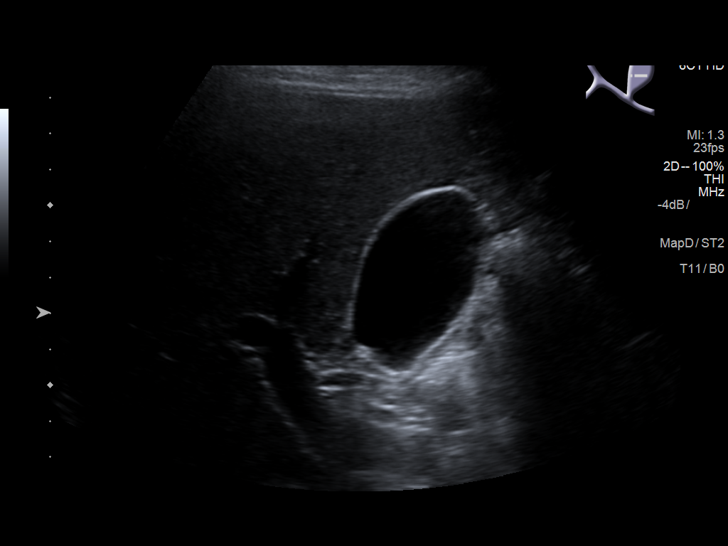
[im 19/57]
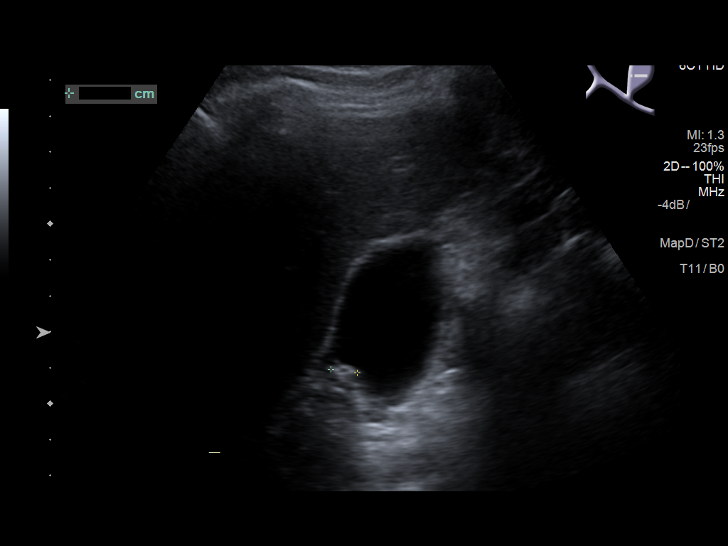
[im 22/57]
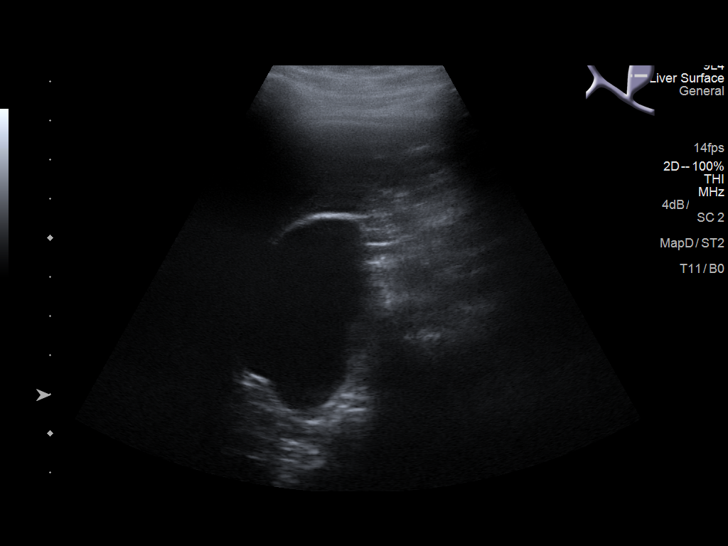
[im 26/57]
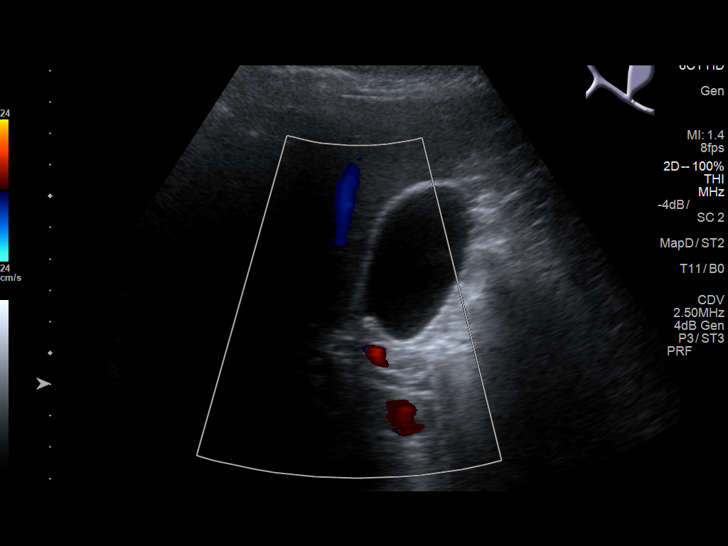
[im 31/57]
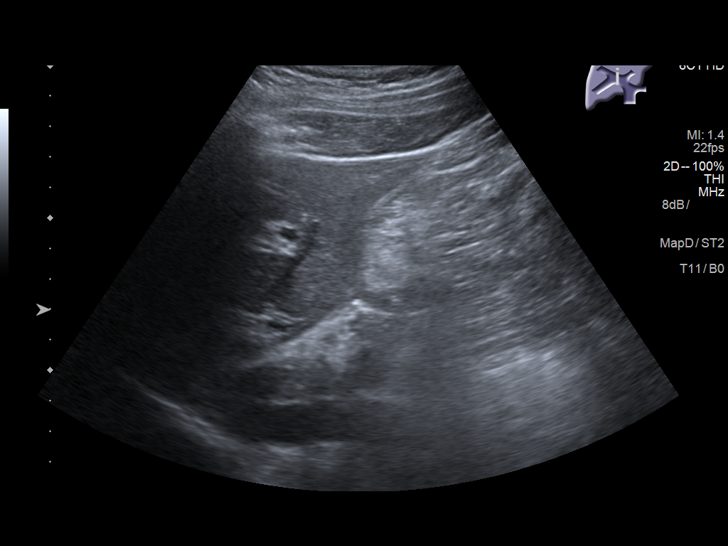
[im 36/57]
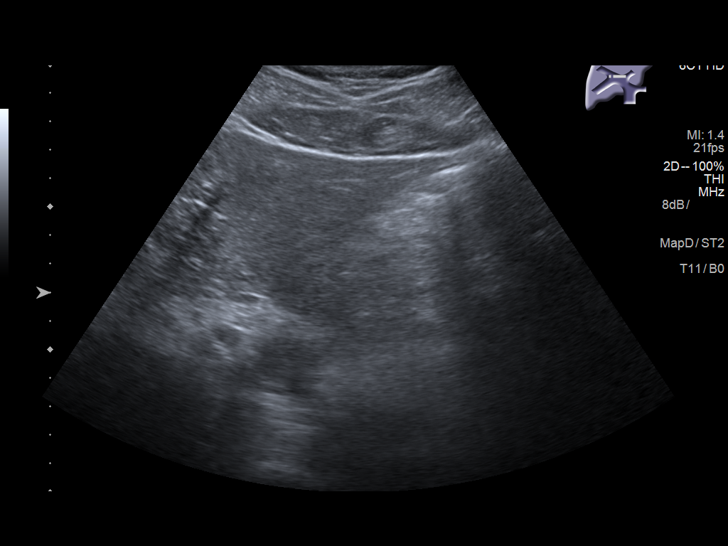
[im 38/57]
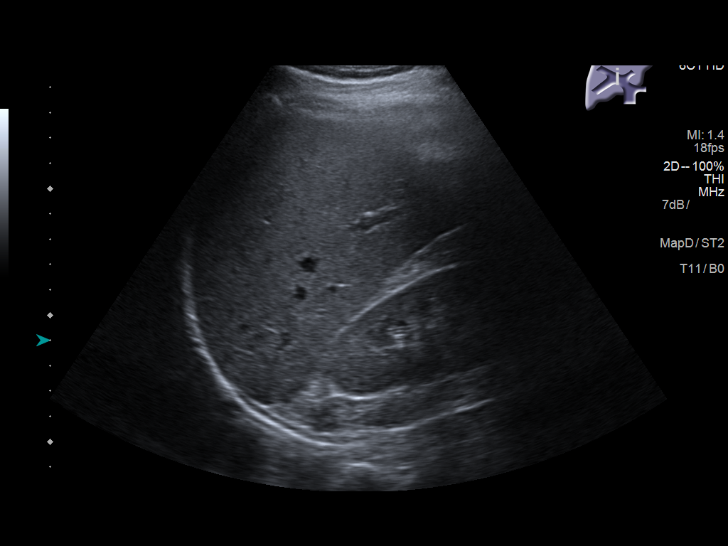
[im 43/57]
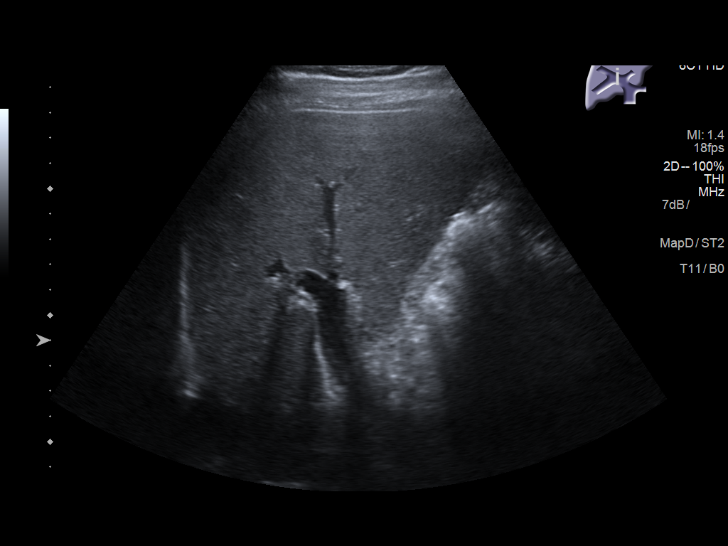
[im 47/57]
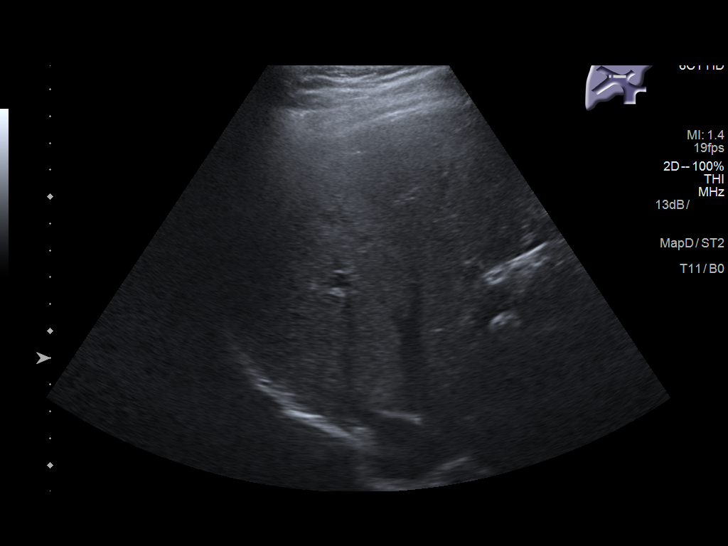
[im 52/57]
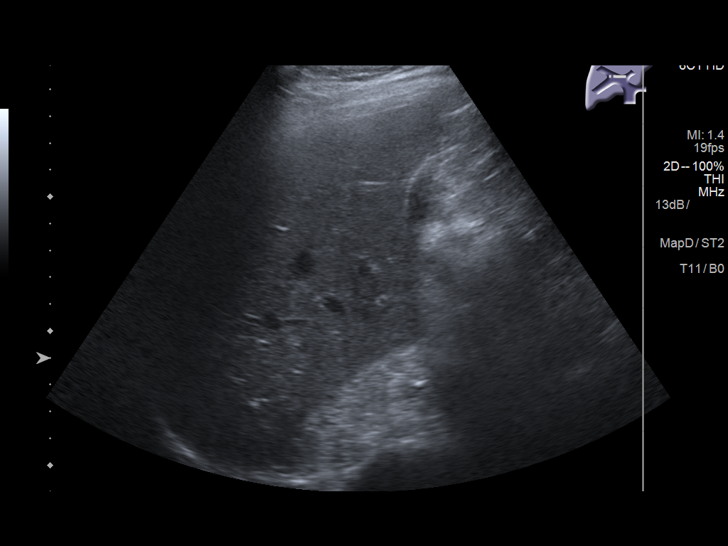
[im 57/57]
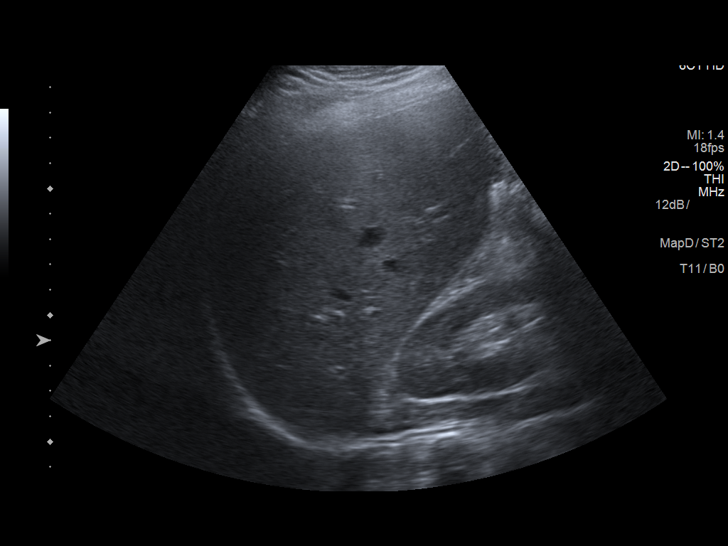

[14 of 25 positions shown; findings below may reference images not displayed]

FINDINGS: Gallbladder:

Gallbladder neck region 7 mm echogenic nonshadowing structure may
represent a polyp or adherent sludge ball of similar size to prior
exam. No gallbladder wall thickening or pericholecystic fluid. Per
ultrasound technologist, patient was not tender over this region
during scanning.

Common bile duct:

Diameter: 3.8 mm

Liver:

No focal lesion identified. Within normal limits in parenchymal
echogenicity. Portal vein is patent on color Doppler imaging with
normal direction of blood flow towards the liver.
IMPRESSION: 7 mm gallbladder polyp versus adherent sludge ball in the
gallbladder neck region of similar size to 2099 exam.

Remainder of exam unremarkable.

## 2021-01-06 ENCOUNTER — Telehealth: Payer: Self-pay

## 2021-01-06 DIAGNOSIS — Z1211 Encounter for screening for malignant neoplasm of colon: Secondary | ICD-10-CM

## 2021-01-06 NOTE — Telephone Encounter (Signed)
Called pt to let him know that he was due for his cologuard and find out if you were still his PCP. Pt stated that about 6 months ago he called to schedule an appt and was told that he could not because he had not been seen in two years, the cut off is three years. Pt stated that he would love to still have you as his PCP because you have been his doctor for 15 years or so. Is it okay to schedule pt an appt?

## 2021-01-07 NOTE — Telephone Encounter (Signed)
LMTCB. Need to schedule pt for an appt with Dr. Darrick Huntsman and see if it is okay to go ahead and order his cologuard(colon cancer screening).

## 2021-01-17 NOTE — Addendum Note (Signed)
Addended by: Sandy Salaam on: 01/17/2021 02:50 PM   Modules accepted: Orders

## 2021-01-17 NOTE — Telephone Encounter (Signed)
Spoke with pt and he stated that he will call back to schedule an appt when it is time for his next physical next year, due to the fact that he had to go some where to have just the physical done for his insurance. Pt did give a verbal okay to go ahead and order cologuard. Order has been placed.

## 2021-01-27 LAB — COLOGUARD: Cologuard: NEGATIVE

## 2023-11-24 LAB — COLOGUARD: COLOGUARD: POSITIVE — AB

## 2023-12-13 ENCOUNTER — Ambulatory Visit: Payer: Self-pay

## 2023-12-13 DIAGNOSIS — K640 First degree hemorrhoids: Secondary | ICD-10-CM | POA: Diagnosis not present

## 2023-12-13 DIAGNOSIS — R195 Other fecal abnormalities: Secondary | ICD-10-CM | POA: Diagnosis not present

## 2023-12-13 DIAGNOSIS — D128 Benign neoplasm of rectum: Secondary | ICD-10-CM | POA: Diagnosis not present

## 2023-12-13 DIAGNOSIS — Z1211 Encounter for screening for malignant neoplasm of colon: Secondary | ICD-10-CM | POA: Diagnosis present

## 2023-12-13 DIAGNOSIS — K5730 Diverticulosis of large intestine without perforation or abscess without bleeding: Secondary | ICD-10-CM | POA: Diagnosis not present
